# Patient Record
Sex: Male | Born: 1965 | Hispanic: Yes | Marital: Married | Smoking: Former smoker
Health system: Southern US, Community
[De-identification: ages and names within clinical notes are randomized; demographics above are authoritative.]

## PROBLEM LIST (undated history)

## (undated) DIAGNOSIS — I10 Essential (primary) hypertension: Secondary | ICD-10-CM

## (undated) DIAGNOSIS — M549 Dorsalgia, unspecified: Secondary | ICD-10-CM

## (undated) DIAGNOSIS — K649 Unspecified hemorrhoids: Secondary | ICD-10-CM

## (undated) DIAGNOSIS — E7800 Pure hypercholesterolemia, unspecified: Secondary | ICD-10-CM

## (undated) HISTORY — PX: NO PAST SURGERIES: SHX2092

## (undated) HISTORY — PX: COLONOSCOPY: SHX174

---

## 1999-11-08 ENCOUNTER — Emergency Department (HOSPITAL_COMMUNITY): Admission: EM | Admit: 1999-11-08 | Discharge: 1999-11-08 | Payer: Self-pay | Admitting: Emergency Medicine

## 1999-11-08 ENCOUNTER — Encounter: Payer: Self-pay | Admitting: Emergency Medicine

## 2001-03-02 ENCOUNTER — Ambulatory Visit (HOSPITAL_COMMUNITY): Admission: RE | Admit: 2001-03-02 | Discharge: 2001-03-02 | Payer: Self-pay | Admitting: Gastroenterology

## 2002-09-17 ENCOUNTER — Ambulatory Visit (HOSPITAL_COMMUNITY): Admission: RE | Admit: 2002-09-17 | Discharge: 2002-09-17 | Payer: Self-pay | Admitting: Internal Medicine

## 2002-09-17 ENCOUNTER — Encounter: Payer: Self-pay | Admitting: Internal Medicine

## 2003-01-13 ENCOUNTER — Emergency Department (HOSPITAL_COMMUNITY): Admission: EM | Admit: 2003-01-13 | Discharge: 2003-01-13 | Payer: Self-pay | Admitting: Emergency Medicine

## 2008-07-17 ENCOUNTER — Emergency Department (HOSPITAL_COMMUNITY): Admission: EM | Admit: 2008-07-17 | Discharge: 2008-07-18 | Payer: Self-pay | Admitting: Emergency Medicine

## 2010-02-02 ENCOUNTER — Encounter: Admission: RE | Admit: 2010-02-02 | Discharge: 2010-02-02 | Payer: Self-pay | Admitting: Specialist

## 2010-05-18 ENCOUNTER — Encounter: Admission: RE | Admit: 2010-05-18 | Discharge: 2010-05-18 | Payer: Self-pay | Admitting: Specialist

## 2010-11-06 NOTE — Procedures (Signed)
[place]  Patient:    Larry Fox, Larry Fox Visit Number: [id number] MRN: [id number]          Service Type: END Location: ENDO Attending Physician:  Charna Elizabeth Dictated by:   Anselmo Rod, M.D. Proc. Date: 03/02/01 Admit Date:  03/02/2001   CC:         Gabriel Earing, M.D.   Procedure Report  DATE OF BIRTH:  1965-10-17  REFERRING PHYSICIAN:  Gabriel Earing, M.D.  PROCEDURE PERFORMED:  Colonoscopy.  ENDOSCOPIST:  Anselmo Rod, M.D.  INSTRUMENT USED:  Olympus pediatric video colonoscope.  INDICATIONS FOR PROCEDURE:  Intermittent rectal bleeding in a 45 year old Hispanic male, rule out colonic polyps, masses, hemorrhoids, etc.  PREPROCEDURE PREPARATION:  Informed consent was procured from the patient. The patient was fasted for eight hours prior to the procedure and prepped with a bottle of magnesium citrate and a gallon of NuLytely the night prior to the procedure.  PREPROCEDURE PHYSICAL:  The patient had stable vital signs.  Neck supple. Chest clear to auscultation.  S1, S2 regular.  Abdomen soft with normal abdominal bowel sounds.  DESCRIPTION OF PROCEDURE:  The patient was placed in the left lateral decubitus position and sedated with 70 mg of Demerol and 5 mg of Versed intravenously.  Once the patient was adequately sedated and maintained on low-flow oxygen and continuous cardiac monitoring, the Olympus video colonoscope was advanced from the rectum to the cecum without difficulty.  The entire colonic mucosa including the terminal ileum appeared healthy and without lesions.  There were small nonbleeding internal and external hemorrhoids seen on retroflexion.  IMPRESSION: 1. Small nonbleeding internal and external hemorrhoids. 2. Normal-appearing colon up to the terminal ileum.  RECOMMENDATIONS: 1. A high fiber diet has been recommended for the patient. 2. Outpatient follow-up is advised in the next four  weeks.Dictated by:   Anselmo Rod, M.D. Attending Physician:  Charna Elizabeth DD:  03/02/01 TD:  03/02/01 Job: [id number]

## 2011-10-31 ENCOUNTER — Encounter (HOSPITAL_COMMUNITY): Payer: Self-pay | Admitting: *Deleted

## 2011-10-31 ENCOUNTER — Emergency Department (HOSPITAL_COMMUNITY)
Admission: EM | Admit: 2011-10-31 | Discharge: 2011-10-31 | Disposition: A | Discharge status: Home / Self Care | Payer: Commercial Managed Care - PPO | Attending: Emergency Medicine | Admitting: Emergency Medicine

## 2011-10-31 DIAGNOSIS — E7800 Pure hypercholesterolemia, unspecified: Secondary | ICD-10-CM | POA: Insufficient documentation

## 2011-10-31 DIAGNOSIS — Z79899 Other long term (current) drug therapy: Secondary | ICD-10-CM | POA: Insufficient documentation

## 2011-10-31 DIAGNOSIS — I10 Essential (primary) hypertension: Secondary | ICD-10-CM | POA: Insufficient documentation

## 2011-10-31 DIAGNOSIS — M545 Low back pain, unspecified: Secondary | ICD-10-CM

## 2011-10-31 DIAGNOSIS — J9801 Acute bronchospasm: Secondary | ICD-10-CM | POA: Insufficient documentation

## 2011-10-31 HISTORY — DX: Pure hypercholesterolemia, unspecified: E78.00

## 2011-10-31 HISTORY — DX: Dorsalgia, unspecified: M54.9

## 2011-10-31 HISTORY — DX: Essential (primary) hypertension: I10

## 2011-10-31 MED ORDER — IBUPROFEN 800 MG PO TABS
800.0000 mg | ORAL_TABLET | Freq: Once | ORAL | Status: AC
  Administered 2011-10-31: 800 mg via ORAL
  Filled 2011-10-31: qty 1

## 2011-10-31 MED ORDER — ALBUTEROL SULFATE HFA 108 (90 BASE) MCG/ACT IN AERS: 1.0000 | INHALATION_SPRAY | Freq: Four times a day (QID) | RESPIRATORY_TRACT | Status: DC | PRN

## 2011-10-31 MED ORDER — NAPROXEN 500 MG PO TABS: 500.0000 mg | ORAL_TABLET | Freq: Two times a day (BID) | ORAL | Status: AC

## 2011-10-31 NOTE — Discharge Instructions (Signed)
Use inhaler as needed for cough and shortness of breath. Take naprosyn as directed for pain and inflammation. Follow up with Dr. Mayford Knife in one week for recheck of ongoing back pain but return to ER for changing or worsening of symptoms.

## 2011-10-31 NOTE — ED Notes (Addendum)
The following information was received through a [employer], [place] [phone]. Pt from work with reports of working in an environment with dust and "fluff" and became short of breath with coughing which has subsided at present. Pt also reports hx of back pain and with coughing episode caused lower back to hurt, pt denied chest pain.

## 2011-10-31 NOTE — ED Provider Notes (Signed)
History     CSN: [id number]  Arrival date & time 10/31/11  1335   First MD Initiated Contact with Patient 10/31/11 1506      Chief Complaint  Patient presents with  . Shortness of Breath  . Back Pain    Lower    (Consider location/radiation/quality/duration/timing/severity/associated sxs/prior treatment) Patient is a 46 y.o. male presenting with shortness of breath and back pain.  Shortness of Breath  Associated symptoms include shortness of breath.  Back Pain   Patient who states he has hx of HTN, high cholesterol and chronic lower back pain that is managed by his PCP Dr. Mayford Knife presents to ER complaining of acute onset but resolved cough and SOB that flared his lower back pain but with improved lower back pain. Patient states he was at work today in a very dusty environment and began to feel "a tickle in throat and chest" and began to cough. Patient states he was coughing so forcefully that he became very SOB and his lower back began to hurt. Patient states he left the dusty environment and coughing resolved and SOB resolved. This happened approximately 2 hours ago and states the lower back pain has improved. He took nothing for symptoms PTA. Patient states back pain was similar to lower back pain of the past. He denies fevers, chills, CP, abdominal pain, n/v/d, dysuria, hematuria, blood in stool, lower extremity numbness/tingling/weakness, loss of bowel or bladder function or saddle seat paresthesias. Patient states he has unknown name of medication at home that he takes on an as needed basis for lower back pain.   Hx obtained by patient through [employer].   Past Medical History  Diagnosis Date  . Back pain   . High cholesterol   . Hypertension     History reviewed. No pertinent past surgical history.  History reviewed. No pertinent family history.  History  Substance Use Topics  . Smoking status: Never Smoker   . Smokeless tobacco: Never Used  .  Alcohol Use: No      Review of Systems  Respiratory: Positive for shortness of breath.   Musculoskeletal: Positive for back pain.  All other systems reviewed and are negative.    Allergies  Review of patient's allergies indicates no known allergies.  Home Medications   Current Outpatient Rx  Name Route Sig Dispense Refill  . ALBUTEROL SULFATE HFA 108 (90 BASE) MCG/ACT IN AERS Inhalation Inhale 1-2 puffs into the lungs every 6 (six) hours as needed for wheezing. 1 Inhaler 0  . NAPROXEN 500 MG PO TABS Oral Take 1 tablet (500 mg total) by mouth 2 (two) times daily. 30 tablet 0    BP 144/75  Pulse 87  Temp(Src) 98.2 F (36.8 C) (Oral)  Resp 16  SpO2 97%  Physical Exam  Nursing note and vitals reviewed. Constitutional: He is oriented to person, place, and time. He appears well-developed and well-nourished.  HENT:  Head: Normocephalic and atraumatic.  Mouth/Throat: Oropharynx is clear and moist.  Eyes: Conjunctivae are normal.  Neck: Normal range of motion. Neck supple.  Cardiovascular: Normal rate and intact distal pulses.   Pulmonary/Chest: Effort normal and breath sounds normal. No respiratory distress. He has no wheezes. He has no rales. He exhibits no tenderness.  Abdominal: Soft. Bowel sounds are normal. He exhibits no distension and no mass. There is no tenderness. There is no rebound and no guarding.  Genitourinary: Penis normal. No penile tenderness.  Musculoskeletal: Normal range of motion. He exhibits no  edema and no tenderness.       5/5 strength of bilateral UE and LE  Lymphadenopathy:    He has no cervical adenopathy.       Right: No inguinal adenopathy present.       Left: No inguinal adenopathy present.  Neurological: He is alert and oriented to person, place, and time. He has normal reflexes.  Skin: Skin is warm and dry. No rash noted.  Psychiatric: He has a normal mood and affect.    ED Course  Procedures (including critical care time)  PO  ibuprofen  Labs Reviewed - No data to display No results found.   1. Bronchospasm   2. Lower back pain       MDM  Hx of chronic back pain managed by PCP with no red flags for back pain with no signs or symptoms of cauda equina or central cord compression with lower right back pain flared with heavy coughing and movement but improved. Ambulating without difficulty. 5/5 strength of bilateral LE  Patient has no complaints of SOB or breathing difficulties currently stating that once he left the dust, the coughing stopped and breathing improved. Normal lung exam without wheeze with good air movement and pulse ox >97% on room air. Symptoms were acute onset and improved with environment changed. Now resolved.         Jenness Corner, PA 10/31/11 1532  Jenness Corner, [place] 10/31/11 1552

## 2011-11-03 NOTE — ED Provider Notes (Signed)
Medical screening examination/treatment/procedure(s) were performed by non-physician practitioner and as supervising physician I was immediately available for consultation/collaboration. Kaylen Motl, MD, FACEP   Jerni Selmer L Alston Berrie, MD 11/03/11 0701 

## 2011-12-17 ENCOUNTER — Other Ambulatory Visit: Payer: Self-pay | Admitting: Physician Assistant

## 2011-12-17 ENCOUNTER — Ambulatory Visit
Admission: RE | Admit: 2011-12-17 | Discharge: 2011-12-17 | Disposition: A | Discharge status: Home / Self Care | Payer: Commercial Managed Care - PPO | Source: Ambulatory Visit | Attending: Physician Assistant | Admitting: Physician Assistant

## 2011-12-17 DIAGNOSIS — M549 Dorsalgia, unspecified: Secondary | ICD-10-CM

## 2016-10-12 ENCOUNTER — Telehealth: Payer: Self-pay | Admitting: Family Medicine

## 2017-05-05 DIAGNOSIS — I10 Essential (primary) hypertension: Secondary | ICD-10-CM | POA: Diagnosis not present

## 2017-05-05 DIAGNOSIS — I119 Hypertensive heart disease without heart failure: Secondary | ICD-10-CM | POA: Diagnosis not present

## 2017-05-05 DIAGNOSIS — M545 Low back pain: Secondary | ICD-10-CM | POA: Diagnosis not present

## 2017-05-10 DIAGNOSIS — Z1211 Encounter for screening for malignant neoplasm of colon: Secondary | ICD-10-CM | POA: Diagnosis not present

## 2017-05-19 DIAGNOSIS — M545 Low back pain: Secondary | ICD-10-CM | POA: Diagnosis not present

## 2017-05-19 DIAGNOSIS — I10 Essential (primary) hypertension: Secondary | ICD-10-CM | POA: Diagnosis not present

## 2017-05-19 DIAGNOSIS — E785 Hyperlipidemia, unspecified: Secondary | ICD-10-CM | POA: Diagnosis not present

## 2017-05-19 DIAGNOSIS — I119 Hypertensive heart disease without heart failure: Secondary | ICD-10-CM | POA: Diagnosis not present

## 2017-06-02 DIAGNOSIS — E785 Hyperlipidemia, unspecified: Secondary | ICD-10-CM | POA: Diagnosis not present

## 2017-06-02 DIAGNOSIS — I119 Hypertensive heart disease without heart failure: Secondary | ICD-10-CM | POA: Diagnosis not present

## 2017-06-02 DIAGNOSIS — M545 Low back pain: Secondary | ICD-10-CM | POA: Diagnosis not present

## 2017-06-07 DIAGNOSIS — Z1211 Encounter for screening for malignant neoplasm of colon: Secondary | ICD-10-CM | POA: Diagnosis not present

## 2017-09-01 DIAGNOSIS — Z131 Encounter for screening for diabetes mellitus: Secondary | ICD-10-CM | POA: Diagnosis not present

## 2017-09-01 DIAGNOSIS — I119 Hypertensive heart disease without heart failure: Secondary | ICD-10-CM | POA: Diagnosis not present

## 2017-09-01 DIAGNOSIS — Z01118 Encounter for examination of ears and hearing with other abnormal findings: Secondary | ICD-10-CM | POA: Diagnosis not present

## 2017-09-01 DIAGNOSIS — Z0000 Encounter for general adult medical examination without abnormal findings: Secondary | ICD-10-CM | POA: Diagnosis not present

## 2017-09-01 DIAGNOSIS — I10 Essential (primary) hypertension: Secondary | ICD-10-CM | POA: Diagnosis not present

## 2017-09-01 DIAGNOSIS — E785 Hyperlipidemia, unspecified: Secondary | ICD-10-CM | POA: Diagnosis not present

## 2017-11-03 DIAGNOSIS — E785 Hyperlipidemia, unspecified: Secondary | ICD-10-CM | POA: Diagnosis not present

## 2017-11-03 DIAGNOSIS — M545 Low back pain: Secondary | ICD-10-CM | POA: Diagnosis not present

## 2017-11-03 DIAGNOSIS — I119 Hypertensive heart disease without heart failure: Secondary | ICD-10-CM | POA: Diagnosis not present

## 2018-05-17 DIAGNOSIS — M545 Low back pain: Secondary | ICD-10-CM | POA: Diagnosis not present

## 2018-05-17 DIAGNOSIS — E785 Hyperlipidemia, unspecified: Secondary | ICD-10-CM | POA: Diagnosis not present

## 2018-05-17 DIAGNOSIS — I119 Hypertensive heart disease without heart failure: Secondary | ICD-10-CM | POA: Diagnosis not present

## 2020-02-15 ENCOUNTER — Emergency Department (HOSPITAL_COMMUNITY): Payer: Commercial Managed Care - PPO

## 2020-02-15 ENCOUNTER — Encounter (HOSPITAL_COMMUNITY): Payer: Self-pay

## 2020-02-15 ENCOUNTER — Emergency Department (HOSPITAL_COMMUNITY)
Admission: EM | Admit: 2020-02-15 | Discharge: 2020-02-15 | Disposition: A | Discharge status: Home / Self Care | Payer: Commercial Managed Care - PPO | Attending: Emergency Medicine | Admitting: Emergency Medicine

## 2020-02-15 DIAGNOSIS — S76102A Unspecified injury of left quadriceps muscle, fascia and tendon, initial encounter: Secondary | ICD-10-CM | POA: Diagnosis present

## 2020-02-15 DIAGNOSIS — S72415A Nondisplaced unspecified condyle fracture of lower end of left femur, initial encounter for closed fracture: Secondary | ICD-10-CM

## 2020-02-15 DIAGNOSIS — S82042A Displaced comminuted fracture of left patella, initial encounter for closed fracture: Secondary | ICD-10-CM

## 2020-02-15 DIAGNOSIS — T1490XA Injury, unspecified, initial encounter: Secondary | ICD-10-CM

## 2020-02-15 DIAGNOSIS — Y929 Unspecified place or not applicable: Secondary | ICD-10-CM | POA: Insufficient documentation

## 2020-02-15 DIAGNOSIS — Y939 Activity, unspecified: Secondary | ICD-10-CM | POA: Insufficient documentation

## 2020-02-15 DIAGNOSIS — Y999 Unspecified external cause status: Secondary | ICD-10-CM | POA: Insufficient documentation

## 2020-02-15 DIAGNOSIS — S7292XA Unspecified fracture of left femur, initial encounter for closed fracture: Secondary | ICD-10-CM | POA: Diagnosis not present

## 2020-02-15 DIAGNOSIS — S82002A Unspecified fracture of left patella, initial encounter for closed fracture: Secondary | ICD-10-CM | POA: Insufficient documentation

## 2020-02-15 DIAGNOSIS — S0121XA Laceration without foreign body of nose, initial encounter: Secondary | ICD-10-CM | POA: Diagnosis not present

## 2020-02-15 DIAGNOSIS — I10 Essential (primary) hypertension: Secondary | ICD-10-CM | POA: Insufficient documentation

## 2020-02-15 HISTORY — DX: Displaced comminuted fracture of left patella, initial encounter for closed fracture: S82.042A

## 2020-02-15 LAB — URINALYSIS, ROUTINE W REFLEX MICROSCOPIC
Bacteria, UA: NONE SEEN
Bilirubin Urine: NEGATIVE
Glucose, UA: NEGATIVE mg/dL
Ketones, ur: NEGATIVE mg/dL
Leukocytes,Ua: NEGATIVE
Nitrite: NEGATIVE
Protein, ur: NEGATIVE mg/dL
Specific Gravity, Urine: 1.005 (ref 1.005–1.030)
pH: 6 (ref 5.0–8.0)

## 2020-02-15 LAB — CBC
HCT: 50 % (ref 39.0–52.0)
Hemoglobin: 17.2 g/dL — ABNORMAL HIGH (ref 13.0–17.0)
MCH: 31 pg (ref 26.0–34.0)
MCHC: 34.4 g/dL (ref 30.0–36.0)
MCV: 90.1 fL (ref 80.0–100.0)
Platelets: 241 10*3/uL (ref 150–400)
RBC: 5.55 MIL/uL (ref 4.22–5.81)
RDW: 12.4 % (ref 11.5–15.5)
WBC: 7.2 10*3/uL (ref 4.0–10.5)
nRBC: 0 % (ref 0.0–0.2)

## 2020-02-15 LAB — COMPREHENSIVE METABOLIC PANEL
ALT: 37 U/L (ref 0–44)
AST: 36 U/L (ref 15–41)
Albumin: 4.2 g/dL (ref 3.5–5.0)
Alkaline Phosphatase: 59 U/L (ref 38–126)
Anion gap: 12 (ref 5–15)
BUN: 10 mg/dL (ref 6–20)
CO2: 20 mmol/L — ABNORMAL LOW (ref 22–32)
Calcium: 8.3 mg/dL — ABNORMAL LOW (ref 8.9–10.3)
Chloride: 109 mmol/L (ref 98–111)
Creatinine, Ser: 0.7 mg/dL (ref 0.61–1.24)
GFR calc Af Amer: >60 mL/min (ref >60)
GFR calc non Af Amer: >60 mL/min (ref >60)
Glucose, Bld: 116 mg/dL — ABNORMAL HIGH (ref 70–99)
Potassium: 3.4 mmol/L — ABNORMAL LOW (ref 3.5–5.1)
Sodium: 141 mmol/L (ref 135–145)
Total Bilirubin: 0.8 mg/dL (ref 0.3–1.2)
Total Protein: 7 g/dL (ref 6.5–8.1)

## 2020-02-15 LAB — I-STAT CHEM 8, ED
BUN: 10 mg/dL (ref 6–20)
Calcium, Ion: 0.98 mmol/L — ABNORMAL LOW (ref 1.15–1.40)
Chloride: 108 mmol/L (ref 98–111)
Creatinine, Ser: 1 mg/dL (ref 0.61–1.24)
Glucose, Bld: 115 mg/dL — ABNORMAL HIGH (ref 70–99)
HCT: 49 % (ref 39.0–52.0)
Hemoglobin: 16.7 g/dL (ref 13.0–17.0)
Potassium: 3.3 mmol/L — ABNORMAL LOW (ref 3.5–5.1)
Sodium: 142 mmol/L (ref 135–145)
TCO2: 21 mmol/L — ABNORMAL LOW (ref 22–32)

## 2020-02-15 LAB — PROTIME-INR
INR: 1 (ref 0.8–1.2)
Prothrombin Time: 12.8 seconds (ref 11.4–15.2)

## 2020-02-15 LAB — LACTIC ACID, PLASMA: Lactic Acid, Venous: 2.1 mmol/L (ref 0.5–1.9)

## 2020-02-15 LAB — ETHANOL: Alcohol, Ethyl (B): 294 mg/dL — ABNORMAL HIGH (ref <10)

## 2020-02-15 LAB — SAMPLE TO BLOOD BANK

## 2020-02-15 MED ORDER — SODIUM CHLORIDE 0.9 % IV SOLN: INTRAVENOUS | Status: DC

## 2020-02-15 MED ORDER — HYDROCODONE-ACETAMINOPHEN 5-325 MG PO TABS
1.0000 | ORAL_TABLET | Freq: Three times a day (TID) | ORAL | 0 refills | Status: AC | PRN
Start: 2020-02-15 — End: 2020-02-20

## 2020-02-15 MED ORDER — SODIUM CHLORIDE 0.9 % IV BOLUS
1000.0000 mL | Freq: Once | INTRAVENOUS | Status: AC
  Administered 2020-02-15: 1000 mL via INTRAVENOUS

## 2020-02-15 MED ORDER — ACETAMINOPHEN 500 MG PO TABS: 1000.0000 mg | ORAL_TABLET | Freq: Three times a day (TID) | ORAL | 0 refills | Status: AC

## 2020-02-15 MED ORDER — IOHEXOL 300 MG/ML  SOLN
100.0000 mL | Freq: Once | INTRAMUSCULAR | Status: AC | PRN
  Administered 2020-02-15: 100 mL via INTRAVENOUS
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
## 2020-02-15 NOTE — ED Provider Notes (Signed)
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 meaning.   Nira Conn, MD 02/15/20 [phone]

## 2020-02-15 NOTE — Discharge Instructions (Signed)
For pain control you may take at 1000 mg of Tylenol every 8 hours scheduled.  In addition you can take 0.5 to 1 tablet of Norco/Vicodin every 8 hours for pain not controlled with the scheduled Tylenol.   (Para controlar el dolor, puede tomar 1000 mg de Tylenol cada 8 horas programadas. Adems, puede tomar de 0.5 a 1 tableta de Norco / Vicodin cada 8 horas para el dolor que no se controla con el Tylenol programado.)

## 2020-02-18 ENCOUNTER — Encounter (HOSPITAL_COMMUNITY): Payer: Self-pay | Admitting: *Deleted

## 2020-03-12 ENCOUNTER — Encounter (HOSPITAL_BASED_OUTPATIENT_CLINIC_OR_DEPARTMENT_OTHER): Payer: Self-pay | Admitting: Orthopedic Surgery

## 2020-03-12 ENCOUNTER — Other Ambulatory Visit: Payer: Self-pay
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
## 2020-03-14 ENCOUNTER — Other Ambulatory Visit (HOSPITAL_COMMUNITY)
Admission: RE | Admit: 2020-03-14 | Discharge: 2020-03-14 | Disposition: A | Discharge status: Home / Self Care | Payer: Commercial Managed Care - PPO | Source: Ambulatory Visit | Attending: Orthopedic Surgery | Admitting: Orthopedic Surgery

## 2020-03-14 DIAGNOSIS — Z20822 Contact with and (suspected) exposure to covid-19: Secondary | ICD-10-CM | POA: Insufficient documentation

## 2020-03-14 DIAGNOSIS — Z01812 Encounter for preprocedural laboratory examination: Secondary | ICD-10-CM | POA: Diagnosis not present

## 2020-03-14 LAB — SARS CORONAVIRUS 2 (TAT 6-24 HRS): SARS Coronavirus 2: NEGATIVE

## 2020-03-18 ENCOUNTER — Ambulatory Visit (HOSPITAL_COMMUNITY): Payer: Commercial Managed Care - PPO

## 2020-03-18 ENCOUNTER — Ambulatory Visit (HOSPITAL_BASED_OUTPATIENT_CLINIC_OR_DEPARTMENT_OTHER): Payer: Commercial Managed Care - PPO | Admitting: Certified Registered Nurse Anesthetist

## 2020-03-18 ENCOUNTER — Ambulatory Visit (HOSPITAL_BASED_OUTPATIENT_CLINIC_OR_DEPARTMENT_OTHER)
Admission: RE | Admit: 2020-03-18 | Discharge: 2020-03-18 | Disposition: A | Discharge status: Home / Self Care | Payer: Commercial Managed Care - PPO | Attending: Orthopedic Surgery | Admitting: Orthopedic Surgery

## 2020-03-18 ENCOUNTER — Encounter (HOSPITAL_BASED_OUTPATIENT_CLINIC_OR_DEPARTMENT_OTHER)
Admission: RE | Disposition: A | Discharge status: Home / Self Care | Payer: Self-pay | Source: Home / Self Care | Attending: Orthopedic Surgery

## 2020-03-18 ENCOUNTER — Encounter (HOSPITAL_BASED_OUTPATIENT_CLINIC_OR_DEPARTMENT_OTHER): Payer: Self-pay | Admitting: Orthopedic Surgery

## 2020-03-18 DIAGNOSIS — E7800 Pure hypercholesterolemia, unspecified: Secondary | ICD-10-CM | POA: Insufficient documentation

## 2020-03-18 DIAGNOSIS — Z87891 Personal history of nicotine dependence: Secondary | ICD-10-CM | POA: Insufficient documentation

## 2020-03-18 DIAGNOSIS — S82009A Unspecified fracture of unspecified patella, initial encounter for closed fracture: Secondary | ICD-10-CM

## 2020-03-18 DIAGNOSIS — S82042A Displaced comminuted fracture of left patella, initial encounter for closed fracture: Secondary | ICD-10-CM | POA: Insufficient documentation

## 2020-03-18 DIAGNOSIS — Z79899 Other long term (current) drug therapy: Secondary | ICD-10-CM | POA: Diagnosis not present

## 2020-03-18 DIAGNOSIS — I10 Essential (primary) hypertension: Secondary | ICD-10-CM | POA: Diagnosis not present

## 2020-03-18 DIAGNOSIS — S82002A Unspecified fracture of left patella, initial encounter for closed fracture: Secondary | ICD-10-CM | POA: Diagnosis present

## 2020-03-18 HISTORY — DX: Unspecified hemorrhoids: K64.9

## 2020-03-18 HISTORY — PX: ORIF PATELLA: SHX5033

## 2020-03-18 LAB — POCT I-STAT, CHEM 8
BUN: 20 mg/dL (ref 6–20)
Calcium, Ion: 1.24 mmol/L (ref 1.15–1.40)
Chloride: 104 mmol/L (ref 98–111)
Creatinine, Ser: 0.8 mg/dL (ref 0.61–1.24)
Glucose, Bld: 120 mg/dL — ABNORMAL HIGH (ref 70–99)
HCT: 48 % (ref 39.0–52.0)
Hemoglobin: 16.3 g/dL (ref 13.0–17.0)
Potassium: 3.9 mmol/L (ref 3.5–5.1)
Sodium: 141 mmol/L (ref 135–145)
TCO2: 23 mmol/L (ref 22–32)

## 2020-03-18 SURGERY — OPEN REDUCTION INTERNAL FIXATION (ORIF) PATELLA
Anesthesia: General | Site: Knee | Laterality: Left

## 2020-03-18 MED ORDER — MIDAZOLAM HCL 2 MG/2ML IJ SOLN
INTRAMUSCULAR | Status: AC
  Filled 2020-03-18: qty 2

## 2020-03-18 MED ORDER — FENTANYL CITRATE (PF) 100 MCG/2ML IJ SOLN
100.0000 ug | Freq: Once | INTRAMUSCULAR | Status: AC
  Administered 2020-03-18: 100 ug via INTRAVENOUS

## 2020-03-18 MED ORDER — DEXAMETHASONE SODIUM PHOSPHATE 10 MG/ML IJ SOLN
INTRAMUSCULAR | Status: AC
  Filled 2020-03-18: qty 1

## 2020-03-18 MED ORDER — CEFAZOLIN SODIUM-DEXTROSE 2-4 GM/100ML-% IV SOLN
INTRAVENOUS | Status: AC
  Filled 2020-03-18: qty 100

## 2020-03-18 MED ORDER — PROMETHAZINE HCL 25 MG/ML IJ SOLN: 6.2500 mg | INTRAMUSCULAR | Status: DC | PRN

## 2020-03-18 MED ORDER — 0.9 % SODIUM CHLORIDE (POUR BTL) OPTIME
TOPICAL | Status: DC | PRN
  Administered 2020-03-18: 1000 mL

## 2020-03-18 MED ORDER — ROPIVACAINE HCL 5 MG/ML IJ SOLN
INTRAMUSCULAR | Status: DC | PRN
  Administered 2020-03-18: 30 mL via PERINEURAL

## 2020-03-18 MED ORDER — HYDROMORPHONE HCL 1 MG/ML IJ SOLN: 0.2500 mg | INTRAMUSCULAR | Status: DC | PRN

## 2020-03-18 MED ORDER — PHENYLEPHRINE 40 MCG/ML (10ML) SYRINGE FOR IV PUSH (FOR BLOOD PRESSURE SUPPORT)
PREFILLED_SYRINGE | INTRAVENOUS | Status: DC | PRN
  Administered 2020-03-18 (×2): 80 ug via INTRAVENOUS

## 2020-03-18 MED ORDER — DEXAMETHASONE SODIUM PHOSPHATE 10 MG/ML IJ SOLN
INTRAMUSCULAR | Status: DC | PRN
  Administered 2020-03-18: 10 mg via INTRAVENOUS

## 2020-03-18 MED ORDER — ONDANSETRON HCL 4 MG/2ML IJ SOLN
INTRAMUSCULAR | Status: AC
  Filled 2020-03-18: qty 2

## 2020-03-18 MED ORDER — LIDOCAINE 2% (20 MG/ML) 5 ML SYRINGE
INTRAMUSCULAR | Status: AC
  Filled 2020-03-18: qty 5

## 2020-03-18 MED ORDER — LACTATED RINGERS IV SOLN: INTRAVENOUS | Status: DC

## 2020-03-18 MED ORDER — KETOROLAC TROMETHAMINE 30 MG/ML IJ SOLN: 30.0000 mg | Freq: Once | INTRAMUSCULAR | Status: DC | PRN

## 2020-03-18 MED ORDER — MIDAZOLAM HCL 2 MG/2ML IJ SOLN
2.0000 mg | Freq: Once | INTRAMUSCULAR | Status: AC
  Administered 2020-03-18: 2 mg via INTRAVENOUS

## 2020-03-18 MED ORDER — FENTANYL CITRATE (PF) 100 MCG/2ML IJ SOLN
INTRAMUSCULAR | Status: DC | PRN
Start: 2020-03-18 — End: 2020-03-20
  Administered 2020-03-18: 50 ug via INTRAVENOUS
  Administered 2020-03-18: 100 ug via INTRAVENOUS

## 2020-03-18 MED ORDER — CEFAZOLIN SODIUM-DEXTROSE 2-4 GM/100ML-% IV SOLN
2.0000 g | INTRAVENOUS | Status: AC
  Administered 2020-03-18: 2 g via INTRAVENOUS

## 2020-03-18 MED ORDER — PHENYLEPHRINE 40 MCG/ML (10ML) SYRINGE FOR IV PUSH (FOR BLOOD PRESSURE SUPPORT)
PREFILLED_SYRINGE | INTRAVENOUS | Status: AC
  Filled 2020-03-18: qty 10

## 2020-03-18 MED ORDER — FENTANYL CITRATE (PF) 100 MCG/2ML IJ SOLN
INTRAMUSCULAR | Status: AC
  Filled 2020-03-18: qty 2

## 2020-03-18 MED ORDER — ONDANSETRON 4 MG PO TBDP: 4.0000 mg | ORAL_TABLET | Freq: Three times a day (TID) | ORAL | 0 refills | Status: AC | PRN

## 2020-03-18 MED ORDER — OXYCODONE HCL 5 MG PO TABS: 5.0000 mg | ORAL_TABLET | Freq: Four times a day (QID) | ORAL | 0 refills | Status: AC | PRN

## 2020-03-18 MED ORDER — PROPOFOL 10 MG/ML IV BOLUS
INTRAVENOUS | Status: AC
  Filled 2020-03-18: qty 20

## 2020-03-18 MED ORDER — PROPOFOL 10 MG/ML IV BOLUS
INTRAVENOUS | Status: DC | PRN
  Administered 2020-03-18: 200 mg via INTRAVENOUS

## 2020-03-18 MED ORDER — FENTANYL CITRATE (PF) 250 MCG/5ML IJ SOLN
INTRAMUSCULAR | Status: AC
  Filled 2020-03-18: qty 5

## 2020-03-18 MED ORDER — ONDANSETRON HCL 4 MG/2ML IJ SOLN
INTRAMUSCULAR | Status: DC | PRN
  Administered 2020-03-18: 4 mg via INTRAVENOUS

## 2020-03-18 MED ORDER — MIDAZOLAM HCL 5 MG/5ML IJ SOLN
INTRAMUSCULAR | Status: DC | PRN
  Administered 2020-03-18: 1 mg via INTRAVENOUS
  Administered 2020-03-18: 2 mg via INTRAVENOUS
  Administered 2020-03-18: 1 mg via INTRAVENOUS

## 2020-03-18 MED ORDER — LIDOCAINE 2% (20 MG/ML) 5 ML SYRINGE
INTRAMUSCULAR | Status: DC | PRN
  Administered 2020-03-18: 50 mg via INTRAVENOUS

## 2020-03-18 SURGICAL SUPPLY — 67 items
2.0 Drill Bit ×1 IMPLANT
ADH SKN CLS APL DERMABOND .7 (GAUZE/BANDAGES/DRESSINGS)
BIT DRILL 2.6 CANN (BIT) ×1 IMPLANT
BIT DRILL CANN F/COMP 2.2 (BIT) ×1 IMPLANT
BNDG COHESIVE 4X5 TAN STRL (GAUZE/BANDAGES/DRESSINGS) ×2 IMPLANT
BNDG COHESIVE 6X5 TAN STRL LF (GAUZE/BANDAGES/DRESSINGS) ×2 IMPLANT
CLOTH BEACON ORANGE TIMEOUT ST (SAFETY) ×2 IMPLANT
COVER WAND RF STERILE (DRAPES) ×2 IMPLANT
CUFF TOURN SGL QUICK 42 (TOURNIQUET CUFF) IMPLANT
DERMABOND ADVANCED (GAUZE/BANDAGES/DRESSINGS)
DERMABOND ADVANCED .7 DNX12 (GAUZE/BANDAGES/DRESSINGS) IMPLANT
DRAPE C-ARM 42X120 X-RAY (DRAPES) ×2 IMPLANT
DRAPE INCISE IOBAN 66X45 STRL (DRAPES) ×2 IMPLANT
DRAPE SHEET LG 3/4 BI-LAMINATE (DRAPES) ×2 IMPLANT
DRAPE U-SHAPE 47X51 STRL (DRAPES) ×2 IMPLANT
DRSG AQUACEL AG ADV 3.5X10 (GAUZE/BANDAGES/DRESSINGS) ×2 IMPLANT
DRSG EMULSION OIL 3X16 NADH (GAUZE/BANDAGES/DRESSINGS) ×2 IMPLANT
DURAPREP 26ML APPLICATOR (WOUND CARE) ×2 IMPLANT
ELECT REM PT RETURN 9FT ADLT (ELECTROSURGICAL) ×2
ELECTRODE REM PT RTRN 9FT ADLT (ELECTROSURGICAL) ×1 IMPLANT
FIBERTAPE 2 W/STRL NDL 17 (SUTURE) ×1 IMPLANT
GAUZE SPONGE 4X4 12PLY STRL (GAUZE/BANDAGES/DRESSINGS) ×4 IMPLANT
GLOVE BIO SURGEON STRL SZ7.5 (GLOVE) ×2 IMPLANT
GLOVE BIOGEL PI IND STRL 8 (GLOVE) ×1 IMPLANT
GLOVE BIOGEL PI INDICATOR 8 (GLOVE) ×1
GOWN STRL REUS W/TWL XL LVL3 (GOWN DISPOSABLE) ×4 IMPLANT
GUIDEWIRE 1.35MM  DUAL TROCAR (WIRE) ×4
GUIDEWIRE 1.35MM DUAL TROCAR (WIRE) ×2 IMPLANT
IMMOBILIZER KNEE 20 (SOFTGOODS) ×2
IMMOBILIZER KNEE 20 THIGH 36 (SOFTGOODS) ×1 IMPLANT
KIT TURNOVER CYSTO (KITS) ×2 IMPLANT
MANIFOLD NEPTUNE II (INSTRUMENTS) ×2 IMPLANT
NS IRRIG 1000ML POUR BTL (IV SOLUTION) ×2 IMPLANT
PACK BASIN DAY SURGERY FS (CUSTOM PROCEDURE TRAY) ×2 IMPLANT
PACK ORTHO EXTREMITY (CUSTOM PROCEDURE TRAY) ×2 IMPLANT
PAD CAST 4YDX4 CTTN HI CHSV (CAST SUPPLIES) ×2 IMPLANT
PADDING CAST COTTON 4X4 STRL (CAST SUPPLIES) ×4
PASSER SUT SWANSON 36MM LOOP (INSTRUMENTS) ×2 IMPLANT
PENCIL SMOKE EVACUATOR (MISCELLANEOUS) IMPLANT
PLATE PATELLA MEDIUM (Plate) ×1 IMPLANT
SCREW LOCK TI QF 3X14 (Screw) ×4 IMPLANT
SCREW LOCK TI QF 3X20 (Screw) ×1 IMPLANT
SCREW LOCKING VARIABLE 3.0X16 (Screw) ×7 IMPLANT
SCREW QCFIX CANN 4.0X44 (Screw) ×2 IMPLANT
SCREW QCKFIX CANN 4.0X40MM (Screw) ×2 IMPLANT
SET PAD KNEE POSITIONER (MISCELLANEOUS) ×2 IMPLANT
SPONGE LAP 18X18 RF (DISPOSABLE) ×4 IMPLANT
STOCKINETTE 6  STRL (DRAPES) ×2
STOCKINETTE 6 STRL (DRAPES) ×1 IMPLANT
STRIP CLOSURE SKIN 1/2X4 (GAUZE/BANDAGES/DRESSINGS) ×2 IMPLANT
SUCTION FRAZIER HANDLE 10FR (MISCELLANEOUS) ×2
SUCTION TUBE FRAZIER 10FR DISP (MISCELLANEOUS) ×1 IMPLANT
SUT ETHIBOND NAB CT1 #1 30IN (SUTURE) ×2 IMPLANT
SUT ETHILON 4 0 PS 2 18 (SUTURE) ×2 IMPLANT
SUT FIBERWIRE #2 38 T-5 BLUE (SUTURE) ×2
SUT MNCRL AB 3-0 PS2 27 (SUTURE) ×2 IMPLANT
SUT VIC AB 0 CT1 27 (SUTURE) ×2
SUT VIC AB 0 CT1 27XBRD ANTBC (SUTURE) ×1 IMPLANT
SUT VIC AB 1 CT1 27 (SUTURE) ×10
SUT VIC AB 1 CT1 27XBRD ANTBC (SUTURE) ×5 IMPLANT
SUT VIC AB 2-0 CT1 27 (SUTURE)
SUT VIC AB 2-0 CT1 TAPERPNT 27 (SUTURE) IMPLANT
SUT VIC AB 2-0 CT2 27 (SUTURE) ×2 IMPLANT
SUTURE FIBERWR #2 38 T-5 BLUE (SUTURE) ×1 IMPLANT
TAK BB THREADED (Anchor) ×2 IMPLANT
TOWEL OR 17X26 10 PK STRL BLUE (TOWEL DISPOSABLE) ×4 IMPLANT
WATER STERILE IRR 1000ML POUR (IV SOLUTION) ×2 IMPLANT

## 2020-03-18 NOTE — Anesthesia Postprocedure Evaluation (Signed)
Anesthesia Post Note  Patient: [name]  Procedure(s) Performed: OPEN REDUCTION INTERNAL (ORIF) FIXATION PATELLA (Left Knee)     Patient location during evaluation: PACU Anesthesia Type: General Level of consciousness: awake and alert Pain management: pain level controlled Vital Signs Assessment: post-procedure vital signs reviewed and stable Respiratory status: spontaneous breathing, nonlabored ventilation, respiratory function stable and patient connected to nasal cannula oxygen Cardiovascular status: blood pressure returned to baseline and stable Postop Assessment: no apparent nausea or vomiting Anesthetic complications: no   No complications documented.  Last Vitals:  Vitals:   03/18/20 1700 03/18/20 1715  BP: 139/78 (!) 144/81  Pulse: 72 80  Resp: 10 17  Temp:  36.9 C  SpO2: 98% 96%    Last Pain:  Vitals:   03/18/20 1633  TempSrc:   PainSc: 0-No pain                 Shelton Silvas

## 2020-03-18 NOTE — Anesthesia Procedure Notes (Signed)
Anesthesia Procedure Image    

## 2020-03-18 NOTE — Discharge Instructions (Signed)
-  Maintain knee brace at all times.  -You may undergo 50% weightbearing to the operative extremity with crutches.  -Maintain postoperative bandages for 2 days.  You may remove these on the third day and begin showering at that time.  Please keep your wound covered with a dry dressing.  -For mild to moderate pain use Tylenol and Advil around-the-clock.  She also apply ice to the left knee for 30 minutes out of each hour that you are awake.  For any breakthrough pain use oxycodone as directed and necessary.  -Return to see Dr. Aundria Rud in 2 weeks for routine postop care.

## 2020-03-18 NOTE — Anesthesia Procedure Notes (Signed)
Procedure Name: LMA Insertion Date/Time: 03/18/2020 2:40 PM Performed by: Bishop Limbo, CRNA Pre-anesthesia Checklist: Patient identified, Emergency Drugs available, Suction available and Patient being monitored Patient Re-evaluated:Patient Re-evaluated prior to induction Oxygen Delivery Method: Circle System Utilized Preoxygenation: Pre-oxygenation with 100% oxygen Induction Type: IV induction Ventilation: Mask ventilation without difficulty LMA: LMA inserted LMA Size: 4.0 Number of attempts: 1 Airway Equipment and Method: Bite block Placement Confirmation: positive ETCO2 Tube secured with: Tape Dental Injury: Teeth and Oropharynx as per pre-operative assessment

## 2020-03-18 NOTE — Anesthesia Procedure Notes (Signed)
Anesthesia Regional Block: Femoral nerve block   Pre-Anesthetic Checklist: ,, timeout performed, Correct Patient, Correct Site, Correct Laterality, Correct Procedure, Correct Position, site marked, Risks and benefits discussed,  Surgical consent,  Pre-op evaluation,  At surgeon's request and post-op pain management  Laterality: Left  Prep: chloraprep       Needles:  Injection technique: Single-shot  Needle Type: Echogenic Needle     Needle Length: 9cm      Additional Needles:   Procedures:,,,, ultrasound used (permanent image in chart),,,,  Narrative:  Start time: 03/18/2020 1:46 PM End time: 03/18/2020 1:51 PM Injection made incrementally with aspirations every 5 mL.  Performed by: Personally   Additional Notes: Patient tolerated the procedure well without complications

## 2020-03-18 NOTE — Op Note (Signed)
03/18/2020  4:19 PM  PATIENT:  Larry Fox    PRE-OPERATIVE DIAGNOSIS:  Left patella fracture  POST-OPERATIVE DIAGNOSIS:  Same  PROCEDURE:  OPEN REDUCTION INTERNAL (ORIF) FIXATION PATELLA  SURGEON:  Yolonda Kida, MD  PHYSICIAN ASSISTANT:  Dion Saucier, PAC.  Physician assistant was necessary throughout the entire duration of the procedure to help with positioning the patient, providing deep retraction for the approach.  He was also utilized for reduction of fracture and placement of internal fixation.  He was also utilized for safe and sound closure of the incision and placement of brace and transported back to PACU.  ANESTHESIA:   General  PREOPERATIVE INDICATIONS:  Larry Fox is a  54 y.o. male with a diagnosis of Left patella fracture who elected for surgical management in order to restore the function of the extensor mechanism.  Unfortunately, at this juncture he is about a month out from the injury and has been in a knee immobilizer.  We discussed that this will create a bit more difficulty with the fixation.  The risks benefits and alternatives were discussed with the patient preoperatively including but not limited to the risks of infection, bleeding, nerve injury, cardiopulmonary complications, the need for revision surgery, hardware prominence, hardware failure, the need for hardware removal, nonunion, malunion, posttraumatic arthritis, stiffness, loss of strength and function, among others, and the patient was willing to proceed.  OPERATIVE IMPLANTS: Arthrex  4.0 mm cannulated screws x2 with fiber tape suture passed through the figure-of-eight cerclage, as well as #2 FiberWire for retinacular closure. A medium star patella plate was used.  Titanium.  We also utilized 9 titanium locking screws through the plate.  OPERATIVE FINDINGS: Displaced patella fracture with comminution of the inferior fragment with multiple coronal plane components in  addition to the main transverse fracture.  There was also dorsal marginal impaction.  OPERATIVE PROCEDURE: The patient was brought to the operating room and placed in the supine position. General anesthesia was administered. IV antibiotics were given. The lower extremity was prepped and draped in usual sterile fashion. The leg was elevated and exsanguinated and the tourniquet was inflated. Time out was performed.   Anterior incision was made over the patella and the fracture fragments identified and cleaned of hematoma. The retinaculum was torn on either side.  The inferior aspect was in 3 separate pieces.  These were all in line with the main transverse component.  We were able to maintain alignment while reducing them back to the proximal fragment.  I reduced the fracture anatomically and held provisionally with a clamp and placed 2 guidewires for the cannulated screws.  The lengths were measured, after being confirmed on C-arm, and then I placed the screws, taking care to make sure that there were threads only on the proximal segment, providing compression at the fracture site, and the tips were not prominent proximally.  C-arm used to confirm reduction and position of the screws, and once I was satisfied with this I then used a Keith needle through the screws bringing a total fiber tape suture in a figure-of-eight type fashion. This provided excellent secondary fixation. I left the screw lengths slightly short of the far cortex in order to minimize the risk for rupture of the FiberWire over the tip of the screws.  Next, due to the comminution and concern for loss of fixation given his delayed presentation we elected to apply a anterior titanium plate.  This was the Arthrex star plate was a  medium.  We then placed a total of 9 locking screws through the plate and into the patella.  This provided excellent fixation.  The wounds were irrigated copiously and the retinaculum repaired with #2 FiberWire,  followed by Vicryl for the subcutaneous tissue with staples, and Steri-Strips and sterile gauze for the skin. The wounds were also injected. A knee immobilizer was applied. The patient was awakened and returned to the PACU in stable and satisfactory condition. There were no complications.   Disposition:  Mr. Lawanna Kobus was placed in a locked Bledsoe brace in full extension.  He may be partial weightbearing with the brace locked in full extension.  He should sleep in the brace.  He will also begin outpatient physical therapy on my extensor mechanism repair protocol.  He will take aspirin, 81 mg, once per day for 6 weeks postoperatively.  I will see him back in the office in 2 weeks for staple removal and wound check.

## 2020-03-18 NOTE — Brief Op Note (Signed)
03/18/2020  4:18 PM  PATIENT:  Larry Fox  54 y.o. male  PRE-OPERATIVE DIAGNOSIS:  Left patella fracture  POST-OPERATIVE DIAGNOSIS:  Left patella fracture  PROCEDURE:  Procedure(s) with comments: OPEN REDUCTION INTERNAL (ORIF) FIXATION PATELLA (Left) - 2 hrs  SURGEON:  Surgeon(s) and Role:    * Yolonda Kida, MD - Primary  PHYSICIAN ASSISTANT: Dion Saucier, PA-C.   ANESTHESIA:   regional and general  EBL:  50 mL   BLOOD ADMINISTERED:none  DRAINS: none   LOCAL MEDICATIONS USED:  NONE  SPECIMEN:  No Specimen  DISPOSITION OF SPECIMEN:  N/A  COUNTS:  YES  TOURNIQUET:   Total Tourniquet Time Documented: Thigh (Left) - 82 minutes Total: Thigh (Left) - 82 minutes   DICTATION: .Note written in EPIC  PLAN OF CARE: Discharge to home after PACU  PATIENT DISPOSITION:  PACU - hemodynamically stable.   Delay start of Pharmacological VTE agent (>24hrs) due to surgical blood loss or risk of bleeding: not applicable
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
## 2020-03-18 NOTE — Anesthesia Preprocedure Evaluation (Signed)
Anesthesia Evaluation  Patient identified by MRN, date of birth, ID band Patient awake    Reviewed: Allergy & Precautions, NPO status , Patient's Chart, lab work & pertinent test results  Airway Mallampati: II  TM Distance: >3 FB Neck ROM: Full    Dental no notable dental hx.    Pulmonary neg pulmonary ROS, former smoker,    Pulmonary exam normal breath sounds clear to auscultation       Cardiovascular hypertension, Pt. on medications Normal cardiovascular exam Rhythm:Regular Rate:Normal     Neuro/Psych negative neurological ROS  negative psych ROS   GI/Hepatic negative GI ROS, (+)     substance abuse  alcohol use, BAC 0.294 01/2020   Endo/Other  negative endocrine ROS  Renal/GU negative Renal ROS  negative genitourinary   Musculoskeletal negative musculoskeletal ROS (+)   Abdominal   Peds negative pediatric ROS (+)  Hematology negative hematology ROS (+)   Anesthesia Other Findings   Reproductive/Obstetrics negative OB ROS                             Anesthesia Physical Anesthesia Plan  ASA: II  Anesthesia Plan: General   Post-op Pain Management:    Induction: Intravenous  PONV Risk Score and Plan: 2 and Ondansetron, Dexamethasone and Treatment may vary due to age or medical condition  Airway Management Planned: LMA  Additional Equipment:   Intra-op Plan:   Post-operative Plan: Extubation in OR  Informed Consent: I have reviewed the patients History and Physical, chart, labs and discussed the procedure including the risks, benefits and alternatives for the proposed anesthesia with the patient or authorized representative who has indicated his/her understanding and acceptance.     Dental advisory given  Plan Discussed with: CRNA and Surgeon  Anesthesia Plan Comments:         Anesthesia Quick Evaluation

## 2020-03-18 NOTE — Progress Notes (Signed)
Assisted Dr. Rose with left, ultrasound guided, femoral block. Side rails up, monitors on throughout procedure. See vital signs in flow sheet. Tolerated Procedure well. 

## 2020-03-20 ENCOUNTER — Encounter (HOSPITAL_BASED_OUTPATIENT_CLINIC_OR_DEPARTMENT_OTHER): Payer: Self-pay | Admitting: Orthopedic Surgery

## 2020-03-20 NOTE — Transfer of Care (Signed)
Immediate Anesthesia Transfer of Care Note  Patient: Larry Fox  Procedure(s) Performed: OPEN REDUCTION INTERNAL (ORIF) FIXATION PATELLA (Left Knee)  Patient Location: PACU  Anesthesia Type:General  Level of Consciousness: awake, oriented and patient cooperative  Airway & Oxygen Therapy: Patient Spontanous Breathing and Patient connected to nasal cannula oxygen  Post-op Assessment: Report given to RN, Post -op Vital signs reviewed and stable and Patient moving all extremities X 4  Post vital signs: Reviewed and stable  Last Vitals:  Vitals Value Taken Time  BP 153/80 03/18/20 1813  Temp 36.7 C 03/18/20 1813  Pulse 86 03/18/20 1813  Resp 12 03/18/20 1813  SpO2 96 % 03/18/20 1813    Last Pain:  Vitals:   03/18/20 1915  TempSrc:   PainSc: 0-No pain      Patients Stated Pain Goal: 5 (03/18/20 1304)  Complications: No complications documented.

## 2022-02-24 IMAGING — RF DG KNEE 1-2V*L*
1 series · 7 of 7 positions shown · non-contrast
Comparison: CT knee, 02/15/2020

FLUOROSCOPY TIME:  41 seconds

CLINICAL DATA: ORIF left patella

EXAM:
LEFT KNEE - 1-2 VIEW; DG C-ARM 1-60 MIN-NO REPORT

[Series 1: run · 7 of 7 slices shown]
[im 1/7]
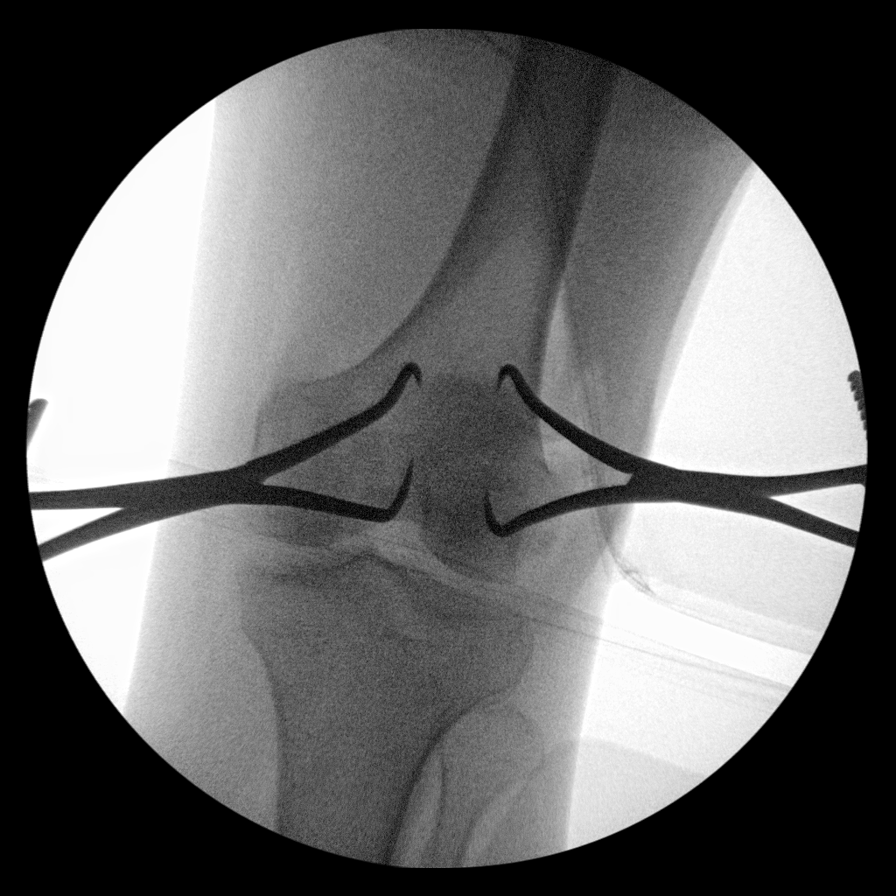
[im 2/7]
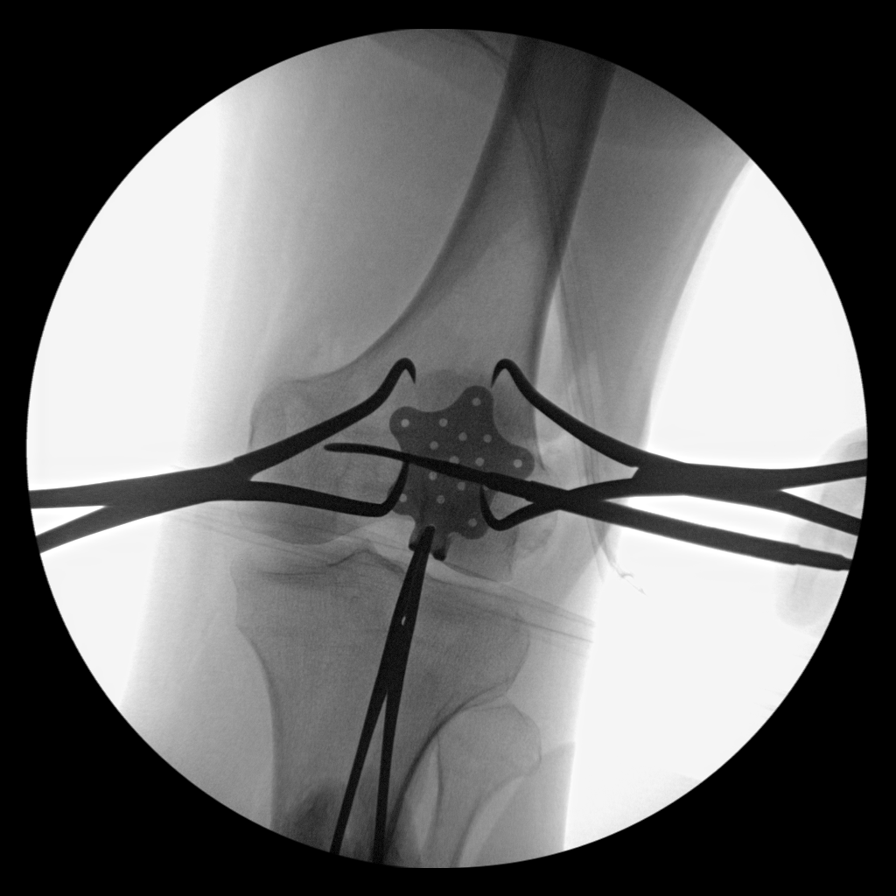
[im 3/7]
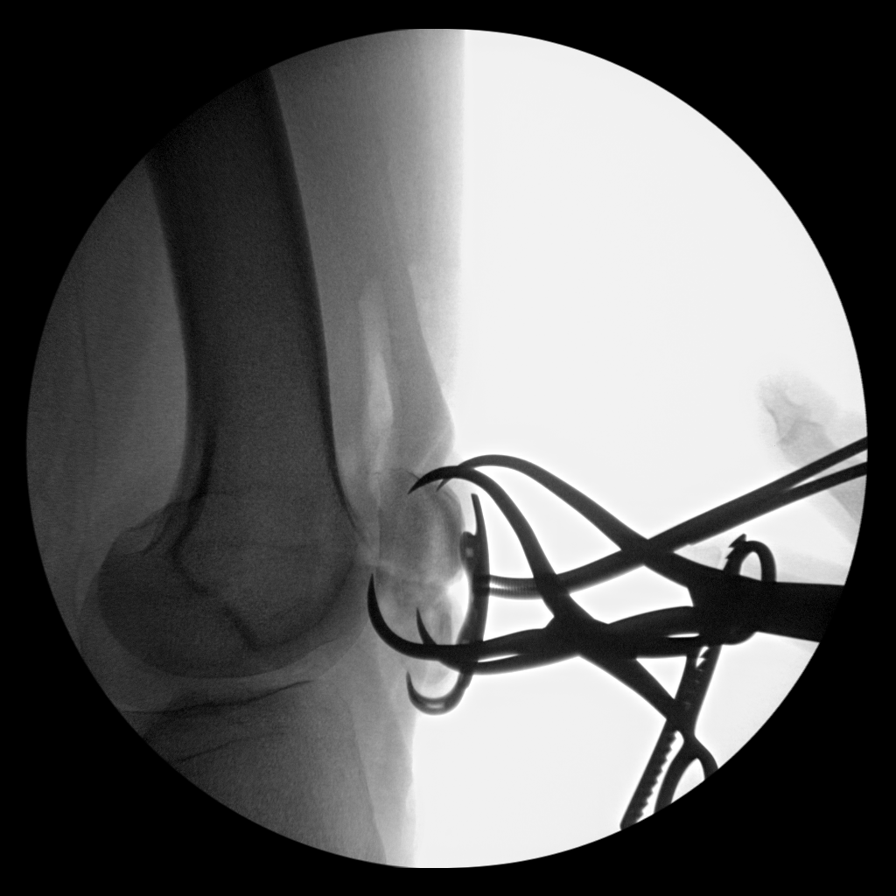
[im 4/7]
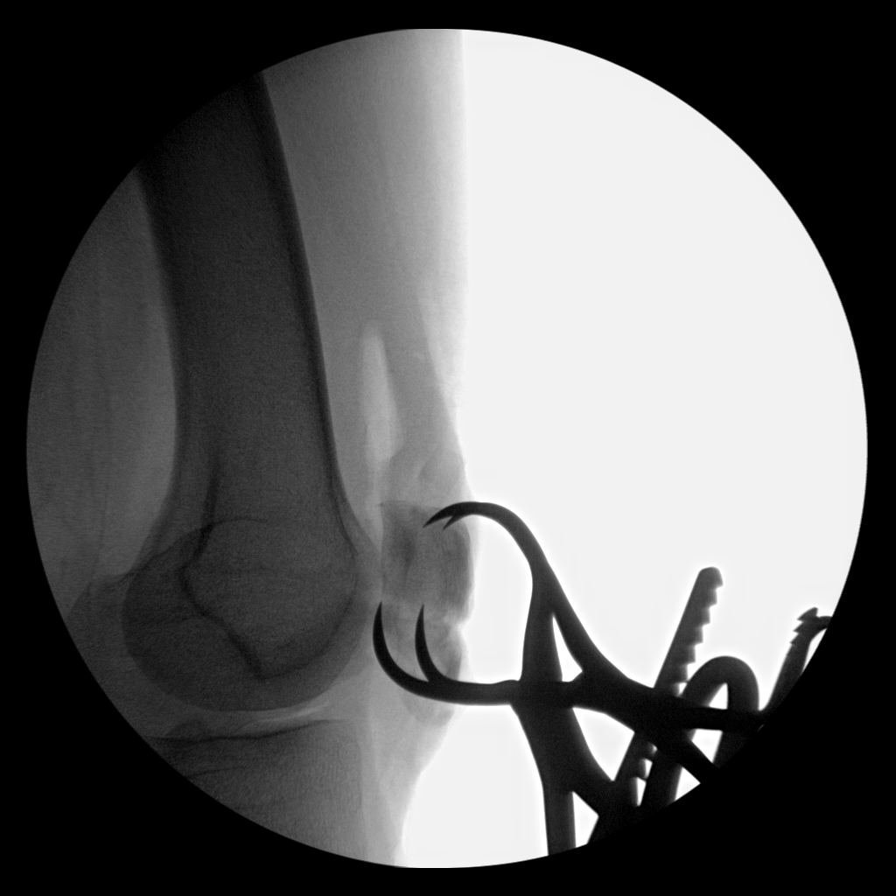
[im 5/7]
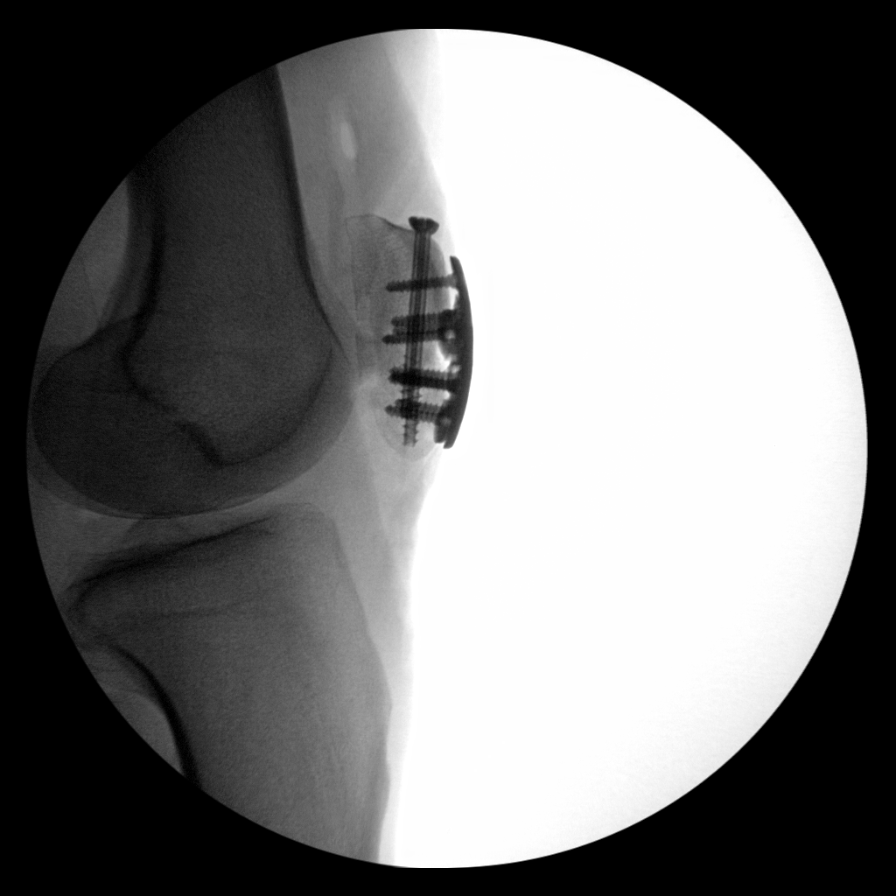
[im 6/7]
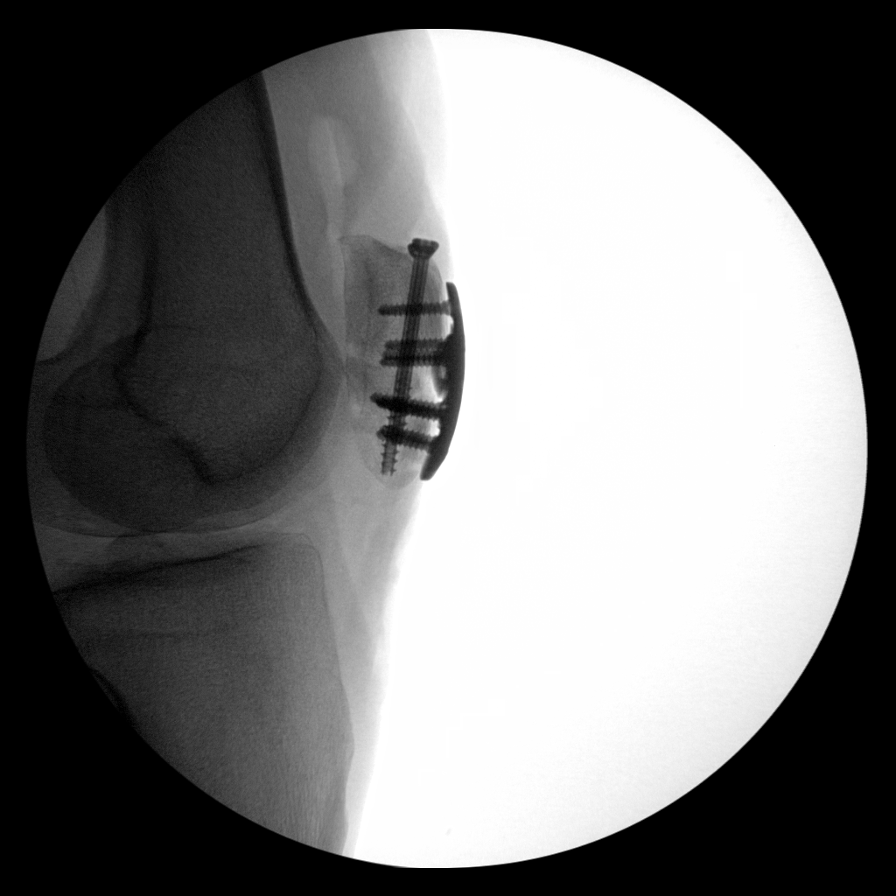
[im 7/7]
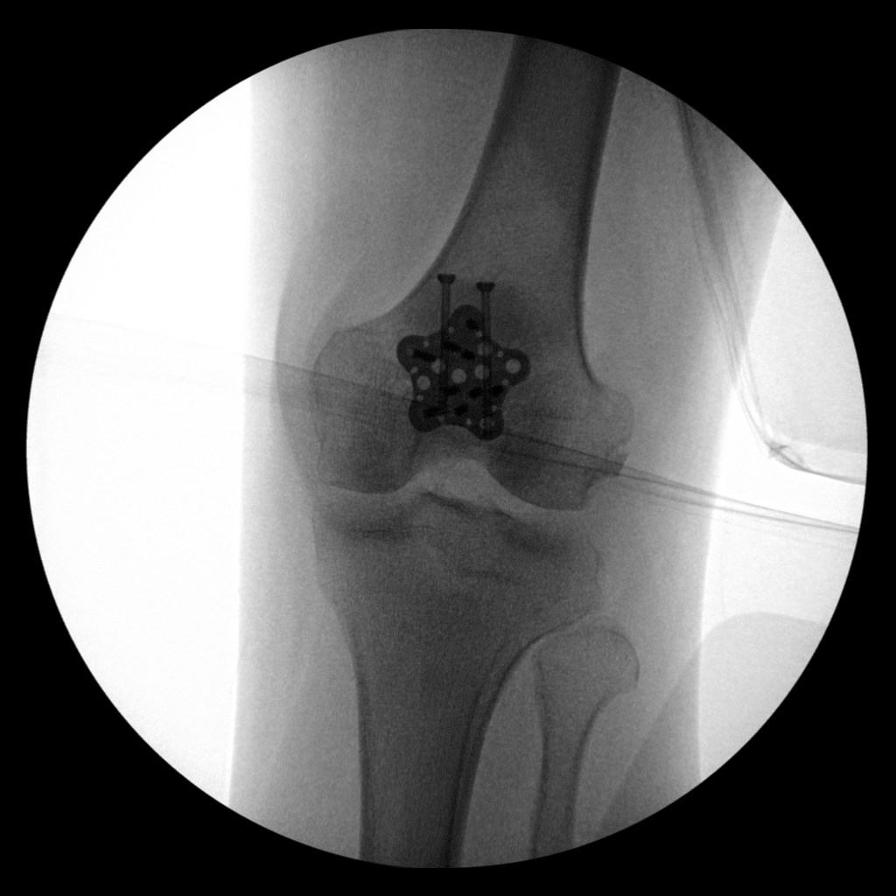

[7 of 7 positions shown; findings below may reference images not displayed]

FINDINGS: Intraoperative fluoroscopic images of the left knee demonstrate
plate and screw fixation of the left patella. No obvious hardware
complication.
IMPRESSION: Intraoperative fluoroscopic images of the left knee demonstrate
plate and screw fixation of the left patella.

## 2022-05-21 ENCOUNTER — Emergency Department (HOSPITAL_COMMUNITY)
Admission: EM | Admit: 2022-05-21 | Discharge: 2022-06-21 | Disposition: E | Payer: Commercial Managed Care - PPO | Attending: Emergency Medicine | Admitting: Emergency Medicine

## 2022-05-21 DIAGNOSIS — I469 Cardiac arrest, cause unspecified: Secondary | ICD-10-CM | POA: Insufficient documentation

## 2022-05-21 DIAGNOSIS — I468 Cardiac arrest due to other underlying condition: Secondary | ICD-10-CM
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
## 2022-06-21 NOTE — Progress Notes (Signed)
Orthopedic Tech Progress Note Patient Details:  Larry Fox 06/21/1875 [id number]  Patient ID: Larry Fox, male   DOB: 06/21/1875, 57 y.o.   MRN: [id number] I attended trauma page. Trinna Post 2022/06/15, 3:03 AM
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
## 2022-06-21 NOTE — Code Documentation (Signed)
Patient time of death occurred at 88.

## 2022-06-21 NOTE — ED Notes (Signed)
Attempted to call Larry Fox [phone] no answer to option to leave message Attempted to calle Larry Fox [phone] number not in service.

## 2022-06-21 DEATH — deceased
